# Patient Record
Sex: Female | Born: 1960
Health system: Southern US, Community
[De-identification: ages and names within clinical notes are randomized; demographics above are authoritative.]

---

## 2011-07-29 ENCOUNTER — Emergency Department: Payer: Self-pay | Admitting: *Deleted

## 2011-07-29 LAB — COMPREHENSIVE METABOLIC PANEL
Alkaline Phosphatase: 45 U/L — ABNORMAL LOW (ref 50–136)
Calcium, Total: 9 mg/dL (ref 8.5–10.1)
Chloride: 100 mmol/L (ref 98–107)
Co2: 26 mmol/L (ref 21–32)
EGFR (African American): >60
EGFR (Non-African Amer.): >60
Glucose: 100 mg/dL — ABNORMAL HIGH (ref 65–99)
SGOT(AST): 15 U/L (ref 15–37)
SGPT (ALT): 23 U/L
Sodium: 138 mmol/L (ref 136–145)
Total Protein: 7.8 g/dL (ref 6.4–8.2)

## 2011-07-29 LAB — URINALYSIS, COMPLETE
Bacteria: NONE SEEN
Bilirubin,UR: NEGATIVE
Ketone: NEGATIVE
Leukocyte Esterase: NEGATIVE
Protein: NEGATIVE
RBC,UR: NONE SEEN /HPF (ref 0–5)
Specific Gravity: 1.016 (ref 1.003–1.030)
Squamous Epithelial: 1
WBC UR: NONE SEEN /HPF (ref 0–5)

## 2011-07-29 LAB — CBC
HCT: 35.3 % (ref 35.0–47.0)
MCHC: 34.5 g/dL (ref 32.0–36.0)
Platelet: 256 10*3/uL (ref 150–440)
RDW: 13.6 % (ref 11.5–14.5)
WBC: 4.6 10*3/uL (ref 3.6–11.0)

## 2011-08-25 ENCOUNTER — Ambulatory Visit: Payer: Self-pay | Admitting: Obstetrics and Gynecology

## 2011-08-25 DIAGNOSIS — I10 Essential (primary) hypertension: Secondary | ICD-10-CM

## 2011-08-30 ENCOUNTER — Ambulatory Visit: Payer: Self-pay | Admitting: Obstetrics and Gynecology

## 2011-08-31 LAB — PLATELET COUNT: Platelet: 244 10*3/uL (ref 150–440)

## 2011-08-31 LAB — HEMOGLOBIN: HGB: 9.9 g/dL — ABNORMAL LOW (ref 12.0–16.0)

## 2011-09-02 LAB — PATHOLOGY REPORT

## 2013-08-23 IMAGING — CT CT STONE STUDY
1 of 3 series · 13 of 32 positions shown, 19 images · non-contrast
Comparison: none

REASON FOR EXAM: lower abdominal pain
COMMENTS:

PROCEDURE:     CT  - CT ABDOMEN /PELVIS WO (STONE)  - July 29, 2011  [DATE]
RESULT:     Comparison: None
TECHNIQUE: Multiple axial images from the lung bases to the symphysis pubis
were obtained without oral and without intravenous contrast.

[Series 2: stone · axial · 0.64mm/px · z∈[-753,-402]mm · 13 of 137 slices shown, 19 images]
[im 10/137  soft-tissue]
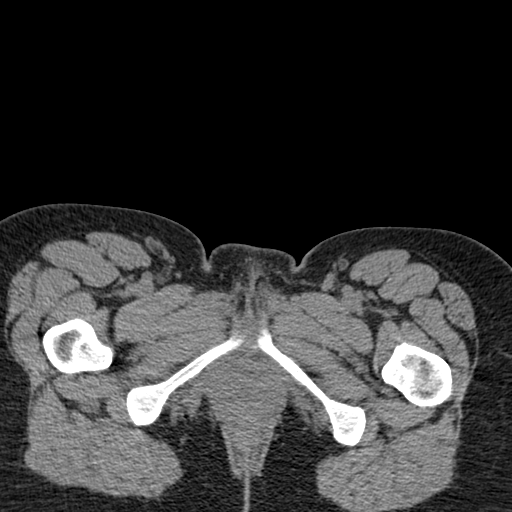
[im 10/137  bone]
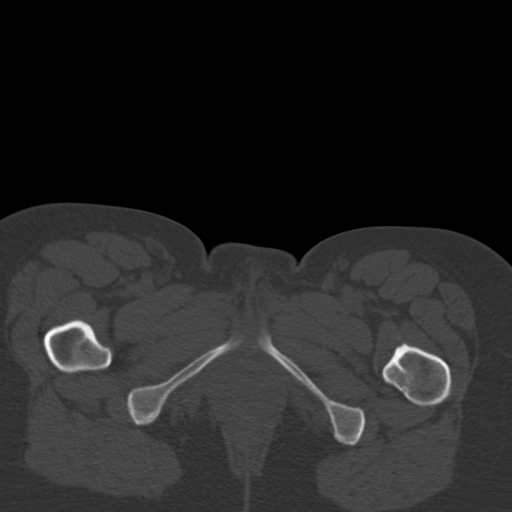
[im 20/137  soft-tissue]
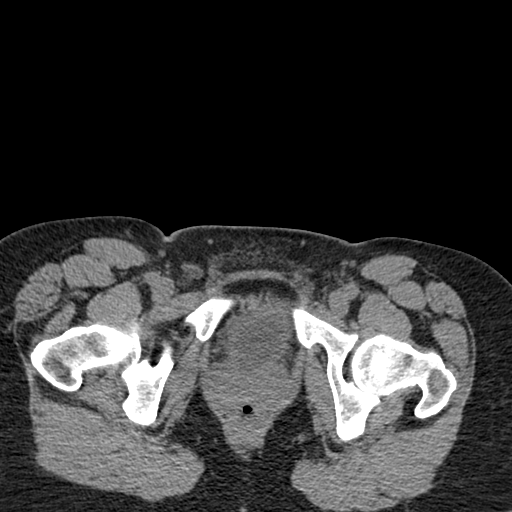
[im 30/137  soft-tissue]
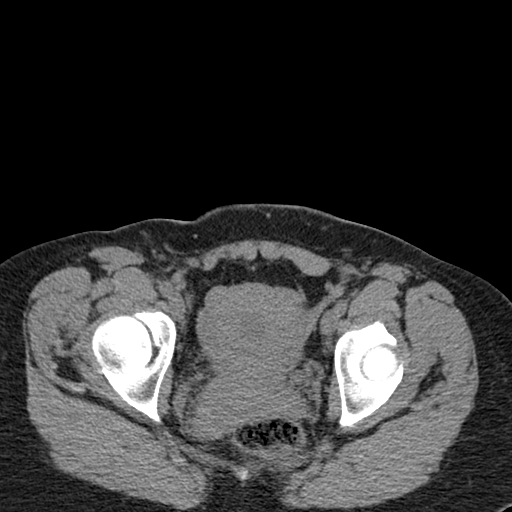
[im 39/137  soft-tissue]
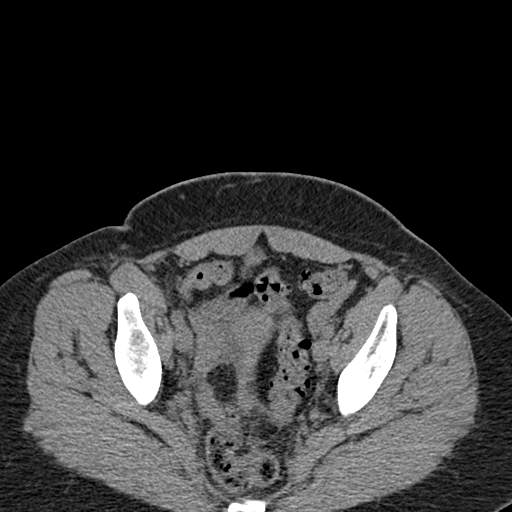
[im 49/137  soft-tissue]
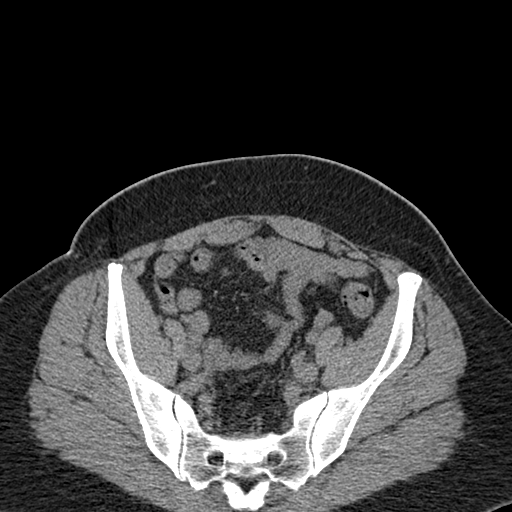
[im 59/137  soft-tissue]
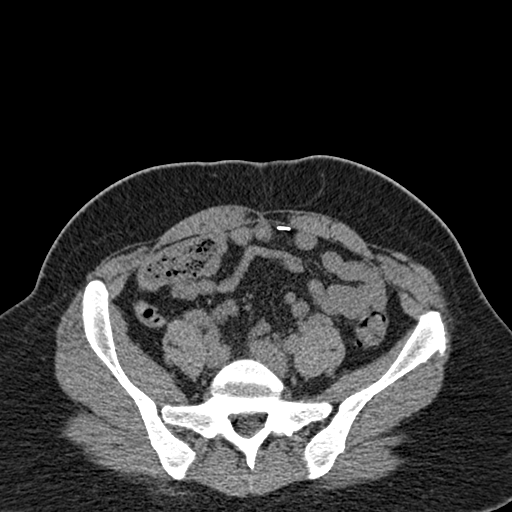
[im 69/137  soft-tissue]
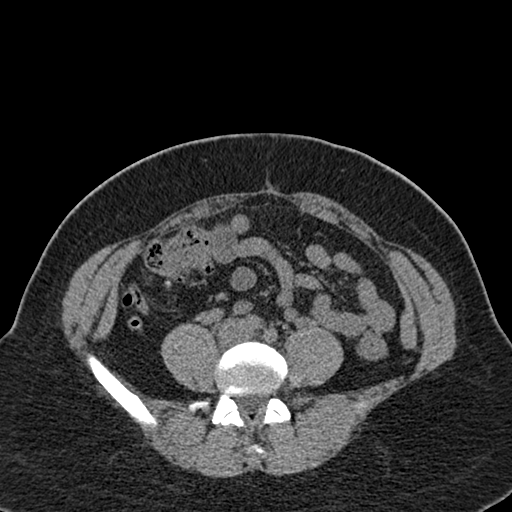
[im 78/137  soft-tissue]
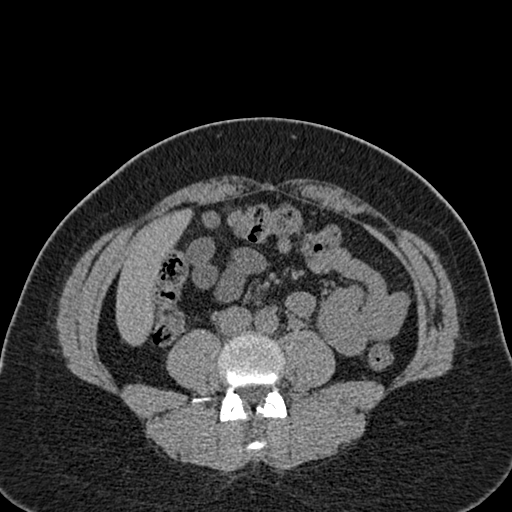
[im 88/137  soft-tissue]
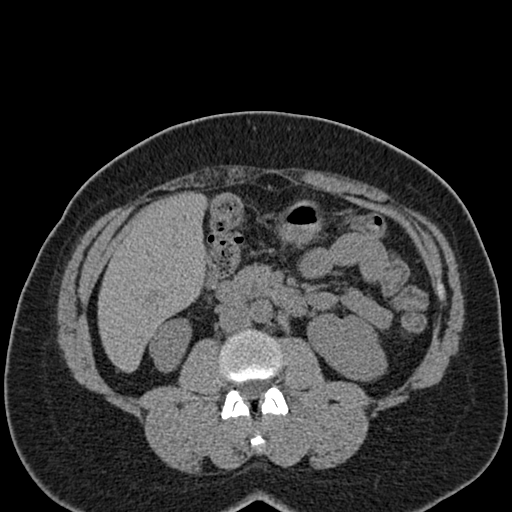
[im 88/137  bone]
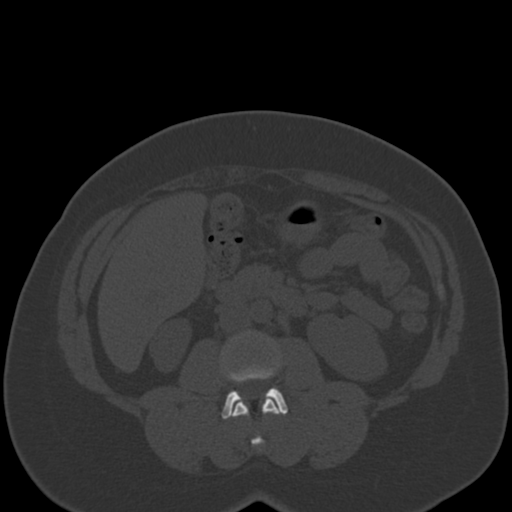
[im 98/137  soft-tissue]
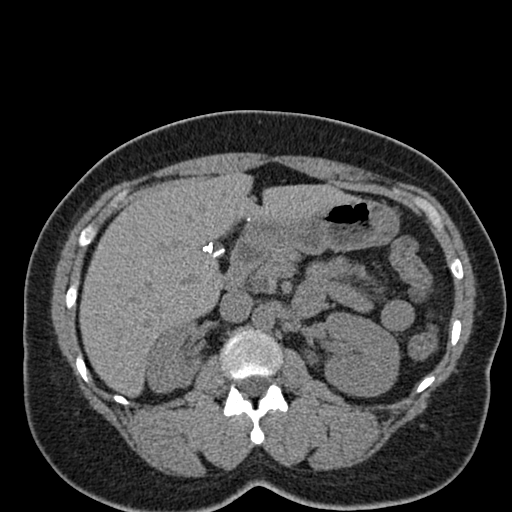
[im 98/137  lung]
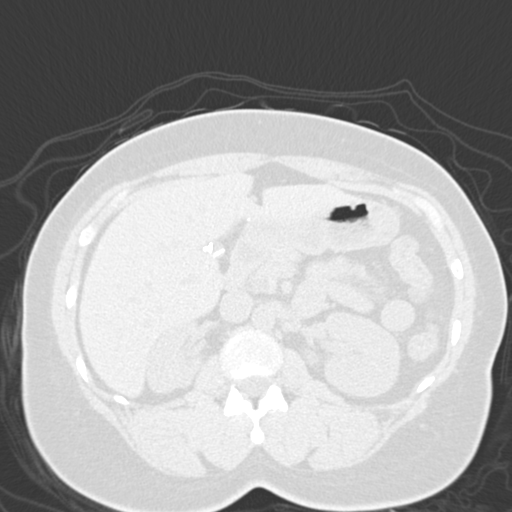
[im 107/137  soft-tissue]
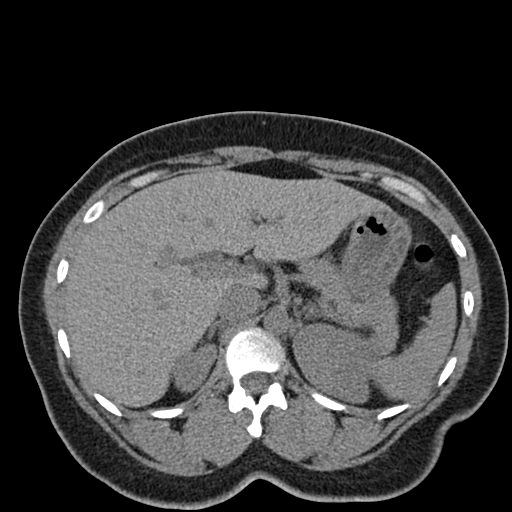
[im 107/137  lung]
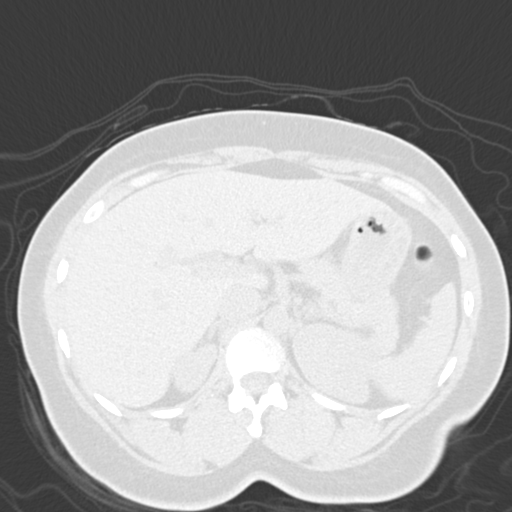
[im 117/137  soft-tissue]
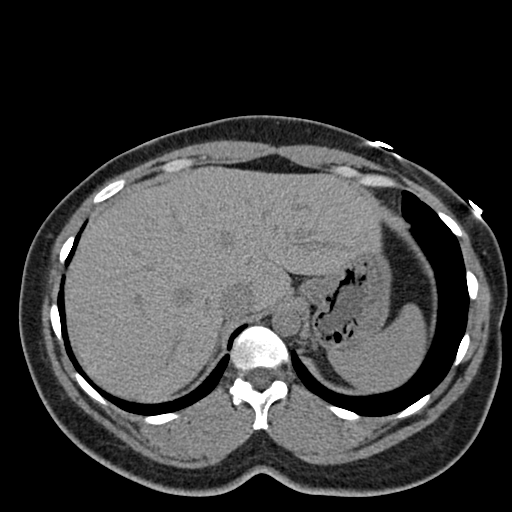
[im 117/137  lung]
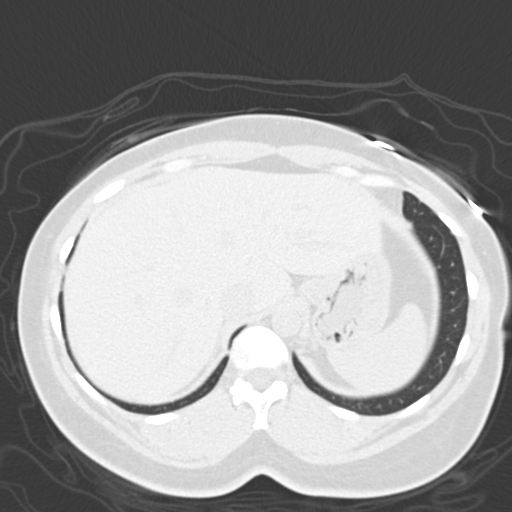
[im 127/137  soft-tissue]
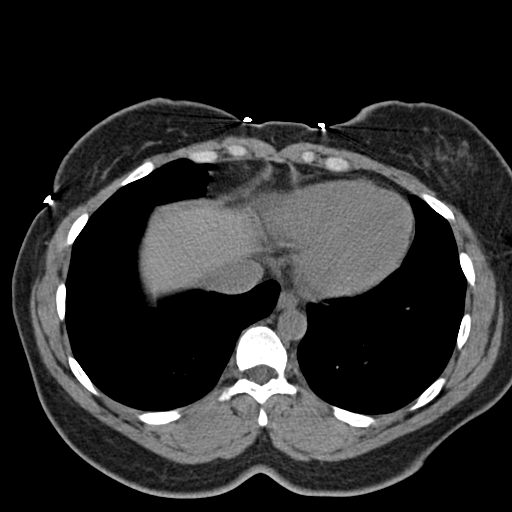
[im 127/137  lung]
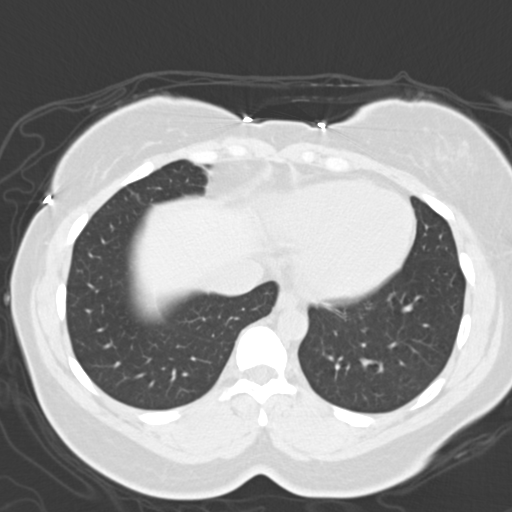

[13 of 32 positions shown; findings below may reference images not displayed]

FINDINGS: Lack of intravenous contrast limits evaluation of the solid abdominal
organs.  Grossly, the liver, spleen, and pancreas are unremarkable. There is
mild thickening of the left gland, without definite discrete mass. Surgical
clips are seen from prior cholecystectomy. No renal calculi or
hydronephrosis. Evaluation of the distal ureters is difficult secondary to
the adjacent decompressed bowel loops. However, no definite ureterectasis
seen. There is a small amount of free fluid in the pelvis, which may be
physiologic. The small and large bowel are normal in caliber. The appendix
is normal. Surgical clips seen in the anterior lower abdomen.

No aggressive lytic or sclerotic osseous lesions are identified.
IMPRESSION: 1. Small amount of free fluid in the pelvis, which is nonspecific and may be
physiologic.
2. Otherwise, no acute findings in the abdomen or pelvis, within the
limitations of this noncontrast study.

## 2013-08-23 IMAGING — US US PELV - US TRANSVAGINAL
1 series · 17 of 25 positions shown · non-contrast
Comparison: none

REASON FOR EXAM: lower abdominal pain, r/o ovarian torsion
COMMENTS:

[Series 1: us pelv - us transvaginal · 17 of 200 slices shown]
[im 1/200]
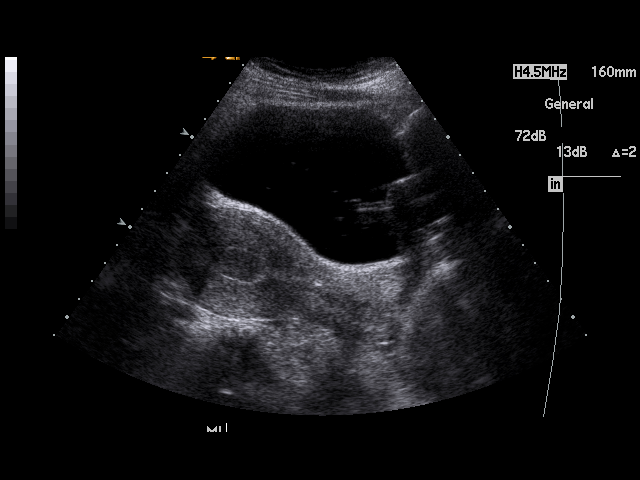
[im 17/200]
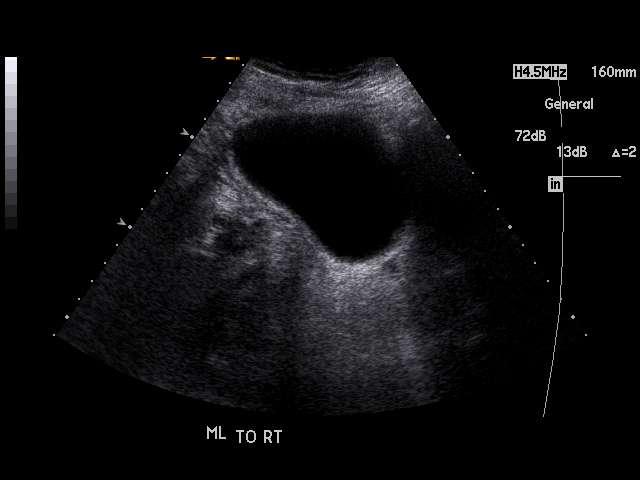
[im 25/200]
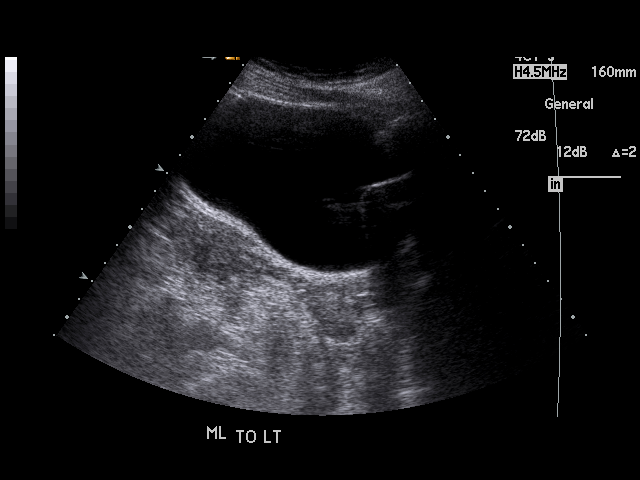
[im 42/200]
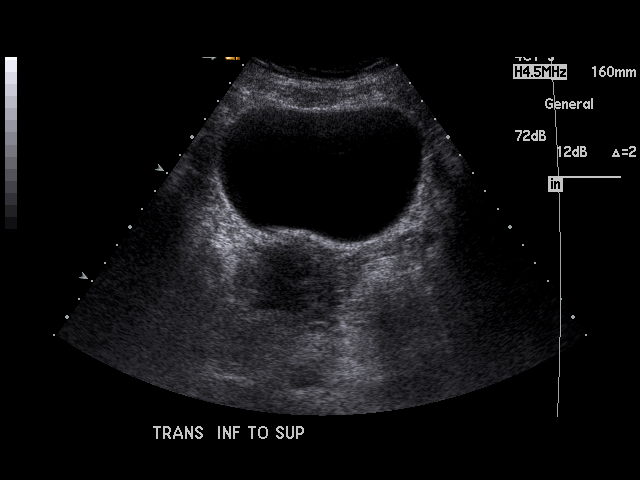
[im 50/200]
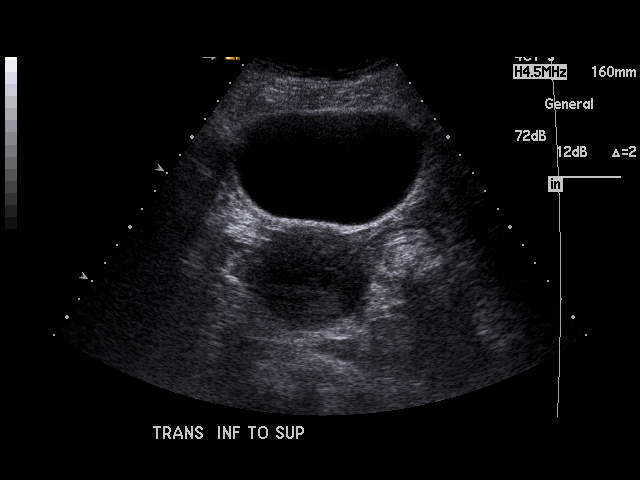
[im 67/200]
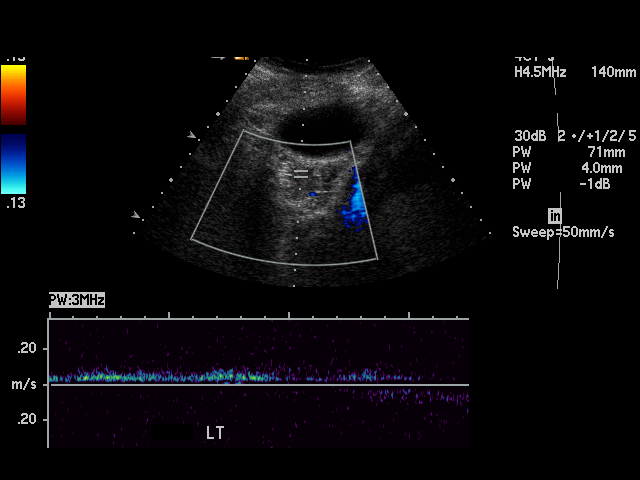
[im 75/200]
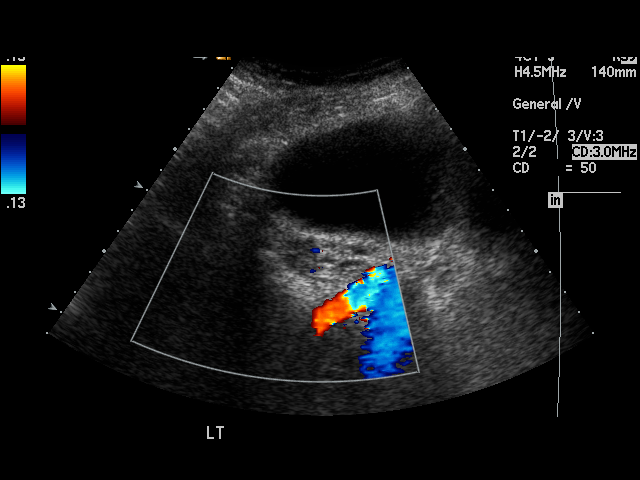
[im 92/200]
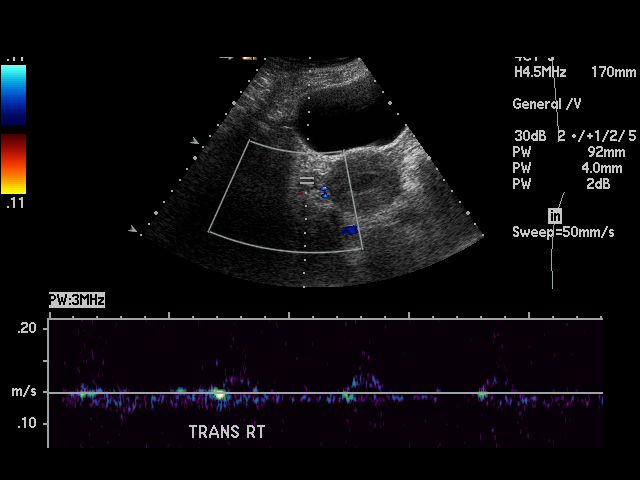
[im 100/200]
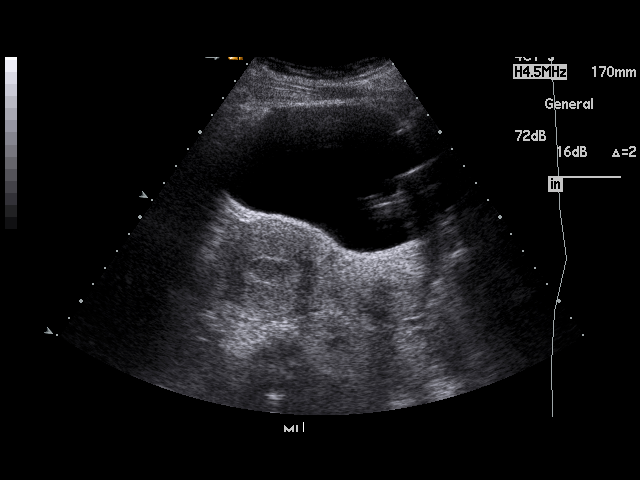
[im 108/200]
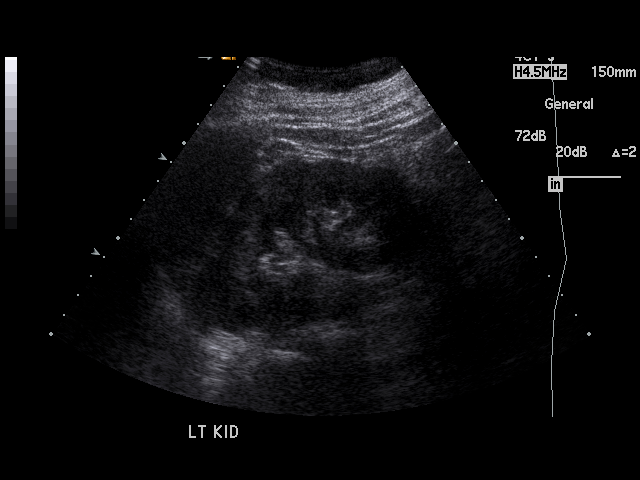
[im 125/200]
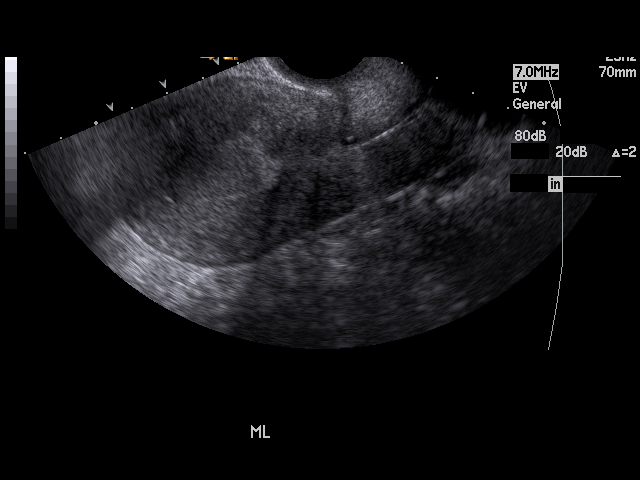
[im 133/200]
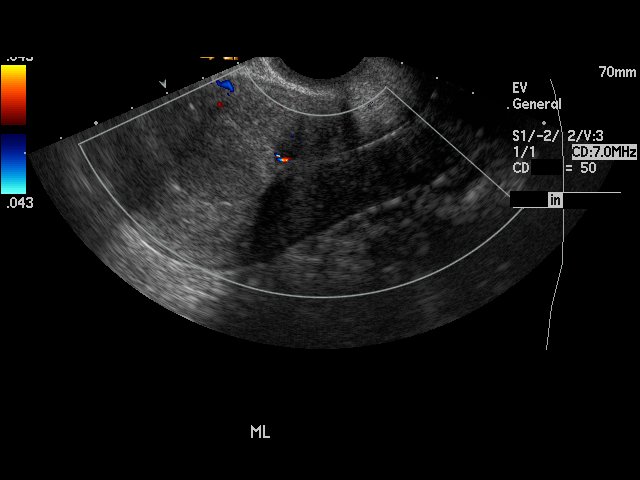
[im 150/200]
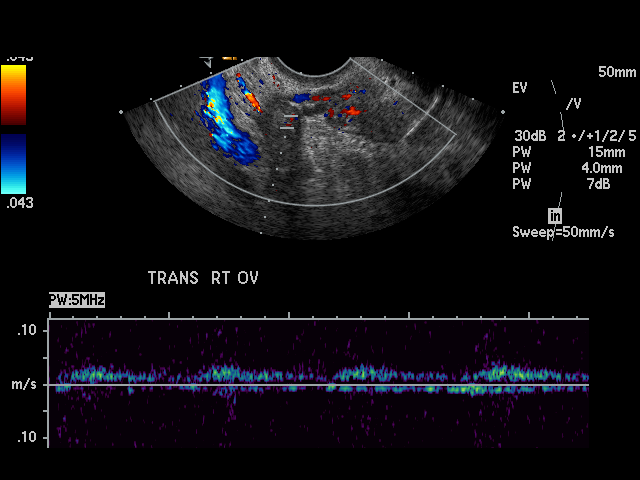
[im 158/200]
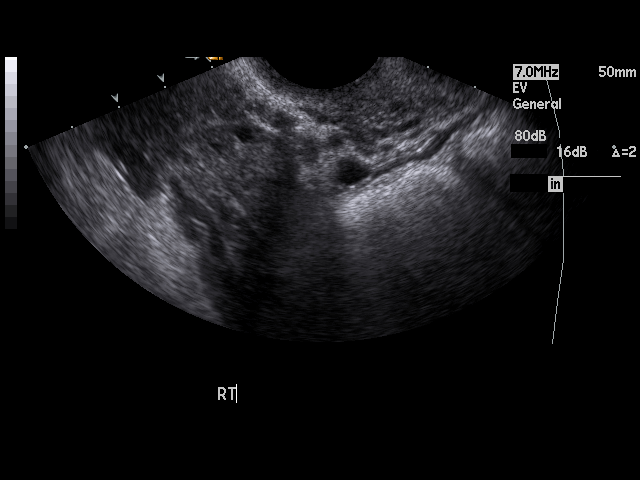
[im 175/200]
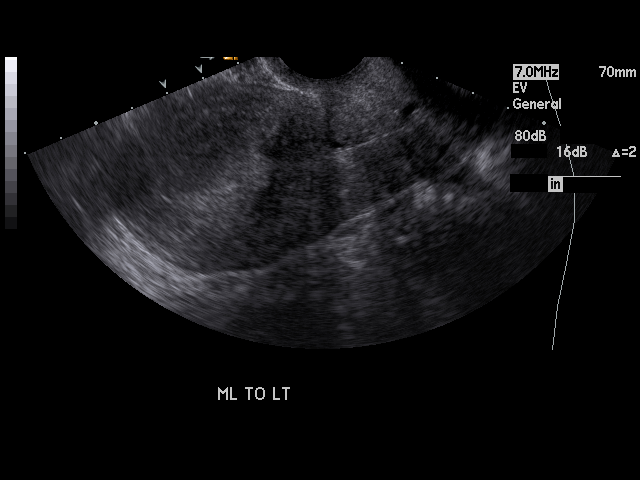
[im 183/200]
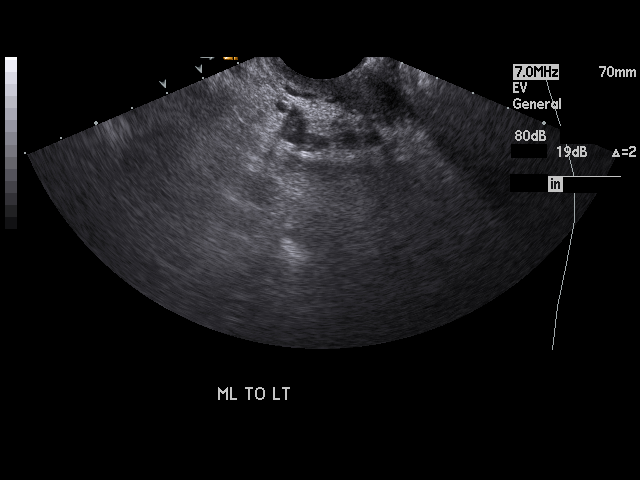
[im 200/200]
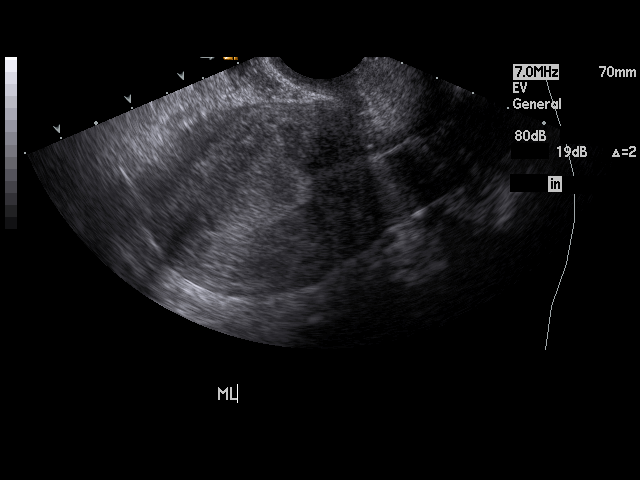

[17 of 25 positions shown; findings below may reference images not displayed]

PROCEDURE:     US  - US PELVIS EXAM W/TRANSVAGINAL  - July 29, 2011 [DATE]

RESULT:     Ultrasound of the pelvis is performed with a transabdominal and
endovaginal technique. The uterus measures 10.04 x 4.69 x 4.75 cm. The
endometrial stripe thickness measures 1.35 cm on transabdominal images.
Endovaginal scanning is performed after survey views of the kidneys show no
gross abnormality. Endovaginal measurements of the endometrial stripe show
1.6 cm is the thickness. Images suggest some debris within the endometrium
which shows evidence of motion during dynamic scanning. Blood flow to both
ovaries is normal. No abnormal fluid collection is present. The right kidney
is slightly smaller than the left. Right kidney length is 7.03 cm compared
to the left kidney length of 10.2 cm.
IMPRESSION: 1. Thickened endometrium as discussed above.
2. Mild atrophy of the right kidney of uncertain etiology.

## 2014-10-20 NOTE — H&P (Signed)
PATIENT NAME:  Paula Grimes, Paula Grimes MR#:  [id number] DATE OF BIRTH:  [birth year]  DATE OF ADMISSION:  08/30/2011  PREOPERATIVE DIAGNOSES:  1. Chronic pelvic pain.  2. Post ablation/tubal ligation syndrome.   HISTORY OF PRESENT ILLNESS: Paula Grimes is a 54 year old single African American female (divorced and widowed after 54 years of marriage), P4-0-2-4, last menstrual period 2008, status post endometrial ablation in 2008, using BTL for contraception, which had been done in 1984, presents for surgical management of chronic pelvic pain secondary to post ablation/tubal ligation syndrome. Patient has been having significant history of lower pelvic cramping-type pain without bleeding. Evaluation in the Emergency Room revealed normal CT scan of the abdomen and pelvis except for a small amount of free fluid. CBC was normal as was the comprehensive metabolic panel. The ultrasound revealed thickened endometrium measuring 1.35 cm. There was some debris in the endometrial cavity of uncertain significance. Both ovaries were noted to be normal. The uterus was slightly enlarged on ultrasound measuring 10.04 x 4.69 x 4.75 cm. Patient's clinical picture is consistent with post ablation/tubal ligation syndrome.   PAST MEDICAL HISTORY:  1. Hypertension.  2. Asthma.  3. History of increased body mass index, with subsequent 83 pound weight loss between 2008 and 2009.  4. History of deep vein thrombosis in 1993.  5. History of diagnosis of factor V Leiden deficiency (recent testing was negative).  6. Endometriosis.   PAST SURGICAL HISTORY:  1. Rotator cuff repair x2 in 2000 and 2008. The 2008 surgery was complicated by sepsis, and subsequent six reoperations because of infection followed by a 40 days hospitalization and 2 unit blood transfusion.  2. Endometrial ablation.  3. BTL.   PAST OB HISTORY: Para 4-0-2-4. Spontaneous vaginal delivery x4 with the largest baby being 8 pounds, 8 ounces. Patient had SAB requiring  dilation and curettage.   PAST GYN HISTORY: Menarche age 16. Amenorrhea was in 2008 following ablation. Patient had past history of fibroids. She did have past history of abnormal Pap smears, the last one identified in 2009.   FAMILY HISTORY: Negative for cancer of the colon or ovary. Maternal aunt had breast cancer. Mom died from multiple myeloma at age 25.   SOCIAL HISTORY: Patient does not smoke She does not drink. She is currently a Psychologist, counselling. Engaged to be married.  REVIEW OF SYSTEMS: Patient denies recent illness. She denies history of reactive airway disease. Coagulopathy history is as noted although work-up for factor V Leiden was negative.   PHYSICAL EXAMINATION:  VITAL SIGNS: Height 5 feet 5,, weight 172. Blood pressure 141/81, heart rate 60, BMI 28.6.  GENERAL: Patient is a pleasant well appearing African American female in no acute distress. She is alert and oriented.   HEENT: Normocephalic, atraumatic. Oropharynx clear.   NECK: Supple. No thyromegaly or adenopathy.   LUNGS: Clear.   HEART: Regular rate and rhythm without murmur.   ABDOMEN: Soft, flat, nondistended, nontender. No abdominal pelvic masses. No hepatosplenomegaly. No hernias.   PELVIC: External genitalia normal. BUS normal. Vagina has good estrogen effect. Cervix is parous without gross lesions. Mild cervical motion tenderness is noted. Uterus is midplane to anteverted, top normal size and 2/4 tender. Adnexa are without palpable masses or tenderness.   RECTAL: Exam reveals normal findings externally.   SKIN: Without rash.   EXTREMITIES: Without clubbing, cyanosis, or edema.   IMPRESSION:  1. 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  2. Past history of endometriosis.  3. Past history of deep  vein thrombosis.  4. Factor V Leiden negative by work up.   PLAN:  1. LAVH plus/minus bilateral salpingo-oophorectomy should pathology be identified. Date of surgery  08/30/2011.  2. Deep vein thrombosis prophylaxis with heparin subcutaneous.  3. Antibiotic prophylaxis.   DRUG ALLERGIES: Morphine and codeine.   CURRENT MEDICATIONS:  1. Ibuprofen. 2. Promethazine. 3. Vicodin. 4. Hyzaar 100/25 mg daily.    ____________________________ Alanda Slim Ciella Obi, MD mad:cms D: 08/29/2011 17:53:05 ET T: 08/30/2011 05:47:05 ET JOB#: [id number]  cc: Hassell Done A. Helix Lafontaine, MD, <Dictator> Alanda Slim Tanise Russman MD ELECTRONICALLY SIGNED 08/31/2011 13:53

## 2014-10-20 NOTE — Op Note (Signed)
PATIENT NAME:  Paula Grimes, Paula Grimes MR#:  [id number] DATE OF BIRTH:  Mar 29, 1961  DATE OF PROCEDURE:  08/31/2011  PREOPERATIVE DIAGNOSES:  1. Chronic pelvic pain.  2. Tubal ligation/ablation syndrome.  3. Endometriosis.   POSTOPERATIVE DIAGNOSES:  1. Chronic pelvic pain.  2. Tubal ligation/ablation syndrome.  3. Endometriosis.   OPERATIVE PROCEDURE: LAVH, RSO.   SURGEON: Fifi Schindler A. Ambree Frances, MD  FIRST ASSISTANT: None.   ANESTHESIA: General endotracheal.   INDICATIONS: The patient is a 54 year old African American female para 4-0-2-4, status post endometrial ablation in 2008, status post BTL for contraception in the past, who presents for surgical management of chronic pelvic pain. Patient has been having significant lower abdominal pelvic cramping without bleeding. In the Emergency Room evaluation revealed normal CT scan of the abdomen and pelvis except for a small amount of free fluid within the endometrial cavity. The ultrasound at the same time revealed a thickened endometrium measuring 1.35 cm. There was debris in the endometrial cavity of uncertain significance. The ovaries were apparently normal. In light of these findings and the patient's clinical picture and the history of previous endometriosis, she was counseled to undergo surgery.   FINDINGS AT SURGERY: Findings at surgery revealed a top normal size uterus approximately eight weeks size. The ovaries appeared normal bilaterally. There was evidence of prior tubal ligation present. There were significant varicosities in the right pelvic veins along the sidewall.   DESCRIPTION OF PROCEDURE: Patient was brought to the Operating Room where she was placed in the supine position. General endotracheal anesthesia was induced without difficulty. She was placed in the low lithotomy position using the bumblebee stirrups. A ChloraPrep, Betadine abdominal, perineal, intravaginal prep and drape was performed in the standard fashion. A Foley catheter  was placed and was draining clear yellow urine from the bladder. A Hulka tenaculum was placed onto the cervix to facilitate uterine manipulation. The laparoscopic portion of the hysterectomy was performed in the standard fashion. Subumbilical vertical incision 5 mm in length was made. The Optiview laparoscopic trocar system was used to place a 5 mm port in the subumbilical incision; no bowel or vascular injury was identified during insertion. Pneumoperitoneum was created with CO2 gas. Two other 5 mm ports were placed in the right and left lower quadrants, respectively. The above-noted findings were photo documented. The abdominal portion of the hysterectomy was performed in a standard manner using the Ace Harmonic scalpel, graspers, and Kleppinger bipolar forceps. The round ligaments were clamped, coagulated, and cut. The fallopian tube, mesosalpinx and utero-ovarian ligaments were clamped, coagulated, and cut using the Harmonic scalpel. The cardinal broad ligament complex was likewise taken down with the Harmonic scalpel and the anterior posterior leafs were opened. Bladder flap was created over the lower uterine segment. The uterine arteries were skeletonized. These were cauterized using the Kleppinger bipolar forceps. Once this part of the procedure was completed, attention was turned to the vaginal aspect. The legs were placed in the high lithotomy position. The weighted speculum was placed into the vagina, a double-tooth tenaculum was placed onto the cervix. Posterior colpotomy was made with Mayo scissors. Uterosacral ligaments were clamped, cut, and stick tied using 0 Vicryl. Sequentially the bladder was dissected off the lower uterine segment with sharp and blunt dissection after the cervix was circumscribed with a scalpel. Eventually the anterior cul-de-sac was entered. The remaining cardinal broad ligament complexes were clamped, cut, and stick tied using 0 Vicryl. Uterine specimen was removed from the  operative field. The posterior cuff was run  with a running locking stitch of 2-0 chromic. Pedicles demonstrated good hemostasis. Vagina was closed with 2-0 chromic in a simple interrupted manner. Repeat laparoscopy was performed and irrigation of the pelvis was accomplished with the irrigant fluid being aspirated. Mild bleeding was cauterized with Kleppinger bipolar forceps. A persistent amount of oozing was noted in the right tube/ovary region and this could not be adequately controlled. Secondary to this bleeding and the right pelvic sidewall varicosities, decision was made to remove the tube and ovary on the right. This was accomplished through cross clamping, cutting, and cauterizing the right infundibulopelvic ligament. The right tube and ovary was then removed from the left lower quadrant 5 mm port that had to be slightly dilated bluntly. Good hemostasis was noted. Ureters were noted to have normal peristalsis and appropriate location. Once satisfied with hemostasis all instrumentation was removed from the abdominopelvic cavity. Pneumoperitoneum was released. The incisions were closed with Dermabond glue. The left lower quadrant port that had to be slightly widened was closed with a 4-0 Vicryl subcuticular stitch along with Dermabond. Once completed the patient was awakened, mobilized, and taken to recovery room in satisfactory condition.   ESTIMATED BLOOD LOSS: 150 mL.   IV FLUIDS: 1600 mL.   URINE OUTPUT: 750 mL.   ____________________________ Prentice Docker. Tressy Kunzman, MD mad:cms D: 08/31/2011 17:32:27 ET T: 09/01/2011 07:43:04 ET JOB#: [id number]  cc: Daphine Deutscher A. Netty Sullivant, MD, <Dictator> Prentice Docker Alissah Redmon MD ELECTRONICALLY SIGNED 09/03/2011 14:11
